# Patient Record
Sex: Female | Born: 1980 | Race: White | Hispanic: No | Marital: Single | Smoking: Never smoker
Health system: Southern US, Community
[De-identification: ages and names within clinical notes are randomized; demographics above are authoritative.]

---

## 2013-06-25 ENCOUNTER — Emergency Department (HOSPITAL_COMMUNITY)
Admission: EM | Admit: 2013-06-25 | Discharge: 2013-06-25 | Disposition: A | Discharge status: Home / Self Care | Payer: No Typology Code available for payment source | Attending: Emergency Medicine | Admitting: Emergency Medicine

## 2013-06-25 ENCOUNTER — Encounter (HOSPITAL_COMMUNITY): Payer: Self-pay | Admitting: Emergency Medicine

## 2013-06-25 ENCOUNTER — Emergency Department (HOSPITAL_COMMUNITY): Payer: No Typology Code available for payment source

## 2013-06-25 DIAGNOSIS — IMO0002 Reserved for concepts with insufficient information to code with codable children: Secondary | ICD-10-CM | POA: Insufficient documentation

## 2013-06-25 DIAGNOSIS — Y92410 Unspecified street and highway as the place of occurrence of the external cause: Secondary | ICD-10-CM | POA: Insufficient documentation

## 2013-06-25 DIAGNOSIS — Y9389 Activity, other specified: Secondary | ICD-10-CM | POA: Insufficient documentation

## 2013-06-25 DIAGNOSIS — S63509A Unspecified sprain of unspecified wrist, initial encounter: Secondary | ICD-10-CM | POA: Insufficient documentation

## 2013-06-25 MED ORDER — IBUPROFEN 800 MG PO TABS: 800.0000 mg | ORAL_TABLET | Freq: Three times a day (TID) | ORAL | Status: AC
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
## 2013-06-30 NOTE — ED Provider Notes (Signed)
Medical screening examination/treatment/procedure(s) were performed by non-physician practitioner and as supervising physician I was immediately available for consultation/collaboration.   EKG Interpretation None        Jaris Kohles T Keyli Duross, MD 06/30/13 1603 

## 2014-12-29 ENCOUNTER — Other Ambulatory Visit: Payer: Self-pay | Admitting: Physician Assistant

## 2014-12-29 ENCOUNTER — Ambulatory Visit
Admission: RE | Admit: 2014-12-29 | Discharge: 2014-12-29 | Disposition: A | Discharge status: Home / Self Care | Payer: BLUE CROSS/BLUE SHIELD | Source: Ambulatory Visit | Attending: Physician Assistant | Admitting: Physician Assistant

## 2014-12-29 DIAGNOSIS — S99912A Unspecified injury of left ankle, initial encounter: Secondary | ICD-10-CM
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
